# Patient Record
Sex: Female | Born: 1975 | Race: Black or African American | Hispanic: No | Marital: Single | Smoking: Never smoker
Health system: Southern US, Community
[De-identification: ages and names within clinical notes are randomized; demographics above are authoritative.]

## PROBLEM LIST (undated history)

## (undated) HISTORY — PX: OOPHORECTOMY: SHX86

---

## 2022-06-01 ENCOUNTER — Emergency Department
Admission: EM | Admit: 2022-06-01 | Discharge: 2022-06-01 | Disposition: A | Discharge status: Home / Self Care | Payer: Medicaid Other | Attending: Emergency Medicine | Admitting: Emergency Medicine

## 2022-06-01 ENCOUNTER — Encounter: Payer: Self-pay | Admitting: Emergency Medicine

## 2022-06-01 ENCOUNTER — Emergency Department: Payer: Medicaid Other

## 2022-06-01 DIAGNOSIS — B9689 Other specified bacterial agents as the cause of diseases classified elsewhere: Secondary | ICD-10-CM | POA: Insufficient documentation

## 2022-06-01 DIAGNOSIS — N390 Urinary tract infection, site not specified: Secondary | ICD-10-CM | POA: Insufficient documentation

## 2022-06-01 DIAGNOSIS — R1031 Right lower quadrant pain: Secondary | ICD-10-CM

## 2022-06-01 DIAGNOSIS — R112 Nausea with vomiting, unspecified: Secondary | ICD-10-CM | POA: Insufficient documentation

## 2022-06-01 LAB — CBC
HCT: 41.5 % (ref 36.0–46.0)
Hemoglobin: 12.8 g/dL (ref 12.0–15.0)
MCH: 27.5 pg (ref 26.0–34.0)
MCHC: 30.8 g/dL (ref 30.0–36.0)
MCV: 89.1 fL (ref 80.0–100.0)
Platelets: 307 10*3/uL (ref 150–400)
RBC: 4.66 MIL/uL (ref 3.87–5.11)
RDW: 11.7 % (ref 11.5–15.5)
WBC: 7.2 10*3/uL (ref 4.0–10.5)
nRBC: 0 % (ref 0.0–0.2)

## 2022-06-01 LAB — COMPREHENSIVE METABOLIC PANEL
ALT: 19 U/L (ref 0–44)
AST: 16 U/L (ref 15–41)
Albumin: 4.7 g/dL (ref 3.5–5.0)
Alkaline Phosphatase: 67 U/L (ref 38–126)
Anion gap: 6 (ref 5–15)
BUN: 19 mg/dL (ref 6–20)
CO2: 25 mmol/L (ref 22–32)
Calcium: 9.5 mg/dL (ref 8.9–10.3)
Chloride: 110 mmol/L (ref 98–111)
Creatinine, Ser: 1.05 mg/dL — ABNORMAL HIGH (ref 0.44–1.00)
GFR, Estimated: >60 mL/min (ref >60)
Glucose, Bld: 133 mg/dL — ABNORMAL HIGH (ref 70–99)
Potassium: 4.8 mmol/L (ref 3.5–5.1)
Sodium: 141 mmol/L (ref 135–145)
Total Bilirubin: 0.5 mg/dL (ref 0.3–1.2)
Total Protein: 7.9 g/dL (ref 6.5–8.1)

## 2022-06-01 LAB — URINALYSIS, ROUTINE W REFLEX MICROSCOPIC
Bacteria, UA: NONE SEEN
Bilirubin Urine: NEGATIVE
Glucose, UA: NEGATIVE mg/dL
Ketones, ur: NEGATIVE mg/dL
Leukocytes,Ua: NEGATIVE
Nitrite: NEGATIVE
Protein, ur: 100 mg/dL — AB
RBC / HPF: >50 RBC/hpf — ABNORMAL HIGH (ref 0–5)
Specific Gravity, Urine: 1.028 (ref 1.005–1.030)
WBC, UA: >50 WBC/hpf — ABNORMAL HIGH (ref 0–5)
pH: 6 (ref 5.0–8.0)

## 2022-06-01 LAB — LIPASE, BLOOD: Lipase: 27 U/L (ref 11–51)

## 2022-06-01 LAB — POC URINE PREG, ED: Preg Test, Ur: NEGATIVE

## 2022-06-01 MED ORDER — SODIUM CHLORIDE 0.9 % IV SOLN
1.0000 g | Freq: Once | INTRAVENOUS | Status: AC
  Administered 2022-06-01: 1 g via INTRAVENOUS
  Filled 2022-06-01: qty 10

## 2022-06-01 MED ORDER — CEPHALEXIN 500 MG PO CAPS: 500.0000 mg | ORAL_CAPSULE | Freq: Two times a day (BID) | ORAL | 0 refills | Status: AC

## 2022-06-01 MED ORDER — IOHEXOL 300 MG/ML  SOLN
100.0000 mL | Freq: Once | INTRAMUSCULAR | Status: AC | PRN
  Administered 2022-06-01: 100 mL via INTRAVENOUS

## 2022-06-01 MED ORDER — ONDANSETRON HCL 4 MG/2ML IJ SOLN
4.0000 mg | Freq: Once | INTRAMUSCULAR | Status: AC
  Administered 2022-06-01: 4 mg via INTRAVENOUS
  Filled 2022-06-01: qty 2

## 2022-06-01 MED ORDER — ONDANSETRON 4 MG PO TBDP: 4.0000 mg | ORAL_TABLET | Freq: Four times a day (QID) | ORAL | 0 refills | Status: AC | PRN

## 2022-06-01 MED ORDER — MORPHINE SULFATE (PF) 4 MG/ML IV SOLN
4.0000 mg | Freq: Once | INTRAVENOUS | Status: AC
  Administered 2022-06-01: 4 mg via INTRAVENOUS
  Filled 2022-06-01: qty 1

## 2022-06-01 MED ORDER — SODIUM CHLORIDE 0.9 % IV BOLUS (SEPSIS)
1000.0000 mL | Freq: Once | INTRAVENOUS | Status: AC
  Administered 2022-06-01: 1000 mL via INTRAVENOUS

## 2022-06-01 NOTE — ED Notes (Signed)
Pt discharge information reviewed. Pt understands need for follow up care and when to return if symptoms worsen. All questions answered. Pt is alert and oriented with even and regular respirations. Pt is seen ambulating out of department with string steady gait.   

## 2022-06-01 NOTE — ED Notes (Signed)
ED Provider at bedside. 

## 2022-06-01 NOTE — ED Triage Notes (Signed)
Pt presents via EMS with complaints of RLQ pain for the last 4 days with associated N/V. No meds given PTA. Pt denies abdominal surgeries, urinary sx,CP, or SOB.

## 2022-06-01 NOTE — Discharge Instructions (Addendum)
You may alternate Tylenol 1000 mg every 6 hours as needed for pain, fever and Ibuprofen 800 mg every 6-8 hours as needed for pain, fever.  Please take Ibuprofen with food.  Do not take more than 4000 mg of Tylenol (acetaminophen) in a 24 hour period.  Steps to find a Primary Care Provider (PCP):  Call [phone] or [phone] to access "[facility] Find a Doctor Service."  2.  You may also go on the [facility] website at [email or web address]  

## 2022-06-01 NOTE — ED Provider Notes (Signed)
[facility] Provider Note    Event Date/Time   First MD Initiated Contact with Patient 06/01/22 [phone]     (approximate)   History   Abdominal Pain   HPI  Marilyn Moody is a 46 y.o. female with history of left oophorectomy who presents to the emergency department with 4 days of right lower quadrant abdominal pain, nausea, vomiting, diarrhea.  No known fevers.  No dysuria, hematuria, vaginal bleeding or discharge.  No known sick contacts.   History provided by patient.    History reviewed. No pertinent past medical history.  Past Surgical History:  Procedure Laterality Date   OOPHORECTOMY Left     MEDICATIONS:  Prior to Admission medications   Not on File    Physical Exam   Triage Vital Signs: ED Triage Vitals [06/01/22 0131]  Enc Vitals Group     BP 111/66     Pulse Rate 87     Resp 20     Temp 98.2 F (36.8 C)     Temp Source Oral     SpO2 98 %     Weight 160 lb (72.6 kg)     Height 5\' 6"  (1.676 m)     Head Circumference      Peak Flow      Pain Score 8     Pain Loc      Pain Edu?      Excl. in GC?     Most recent vital signs: Vitals:   06/01/22 0131 06/01/22 0531  BP: 111/66 120/79  Pulse: 87 82  Resp: 20 17  Temp: 98.2 F (36.8 C)   SpO2: 98% 98%    CONSTITUTIONAL: Alert and oriented and responds appropriately to questions. Well-appearing; well-nourished HEAD: Normocephalic, atraumatic EYES: Conjunctivae clear, pupils appear equal, sclera nonicteric ENT: normal nose; moist mucous membranes NECK: Supple, normal ROM CARD: RRR; S1 and S2 appreciated; no murmurs, no clicks, no rubs, no gallops RESP: Normal chest excursion without splinting or tachypnea; breath sounds clear and equal bilaterally; no wheezes, no rhonchi, no rales, no hypoxia or respiratory distress, speaking full sentences ABD/GI: Normal bowel sounds; non-distended; soft, tender to palpation in the right lower quadrant without guarding or rebound, no  pelvic tenderness, no tenderness to right upper abdomen BACK: The back appears normal EXT: Normal ROM in all joints; no deformity noted, no edema; no cyanosis SKIN: Normal color for age and race; warm; no rash on exposed skin NEURO: Moves all extremities equally, normal speech PSYCH: The patient's mood and manner are appropriate.   ED Results / Procedures / Treatments   LABS: (all labs ordered are listed, but only abnormal results are displayed) Labs Reviewed  COMPREHENSIVE METABOLIC PANEL - Abnormal; Notable for the following components:      Result Value   Glucose, Bld 133 (*)    Creatinine, Ser 1.05 (*)    All other components within normal limits  URINALYSIS, ROUTINE W REFLEX MICROSCOPIC - Abnormal; Notable for the following components:   Color, Urine AMBER (*)    APPearance CLOUDY (*)    Hgb urine dipstick LARGE (*)    Protein, ur 100 (*)    RBC / HPF >50 (*)    WBC, UA >50 (*)    All other components within normal limits  URINE CULTURE  LIPASE, BLOOD  CBC  POC URINE PREG, ED     EKG:  RADIOLOGY: My personal review and interpretation of imaging: CT abdomen pelvis shows normal appendix.  No other acute abnormality.  No kidney stone or hydronephrosis.  Adnexa appear normal.  I have personally reviewed all radiology reports.   CT ABDOMEN PELVIS W CONTRAST  Result Date: 06/01/2022 CLINICAL DATA:  46 year old female presenting with history of right lower quadrant abdominal pain for the past 4 days with some associated nausea and vomiting. EXAM: CT ABDOMEN AND PELVIS WITH CONTRAST TECHNIQUE: Multidetector CT imaging of the abdomen and pelvis was performed using the standard protocol following bolus administration of intravenous contrast. RADIATION DOSE REDUCTION: This exam was performed according to the departmental dose-optimization program which includes automated exposure control, adjustment of the mA and/or kV according to patient size and/or use of iterative  reconstruction technique. CONTRAST:  OMNIPAQUE IOHEXOL 300 MG/ML  SOLN COMPARISON:  No priors. FINDINGS: Lower chest: Unremarkable. Hepatobiliary: No suspicious cystic or solid hepatic lesions. No intra or extrahepatic biliary ductal dilatation. Gallbladder is unremarkable in appearance. Pancreas: No pancreatic mass. No pancreatic ductal dilatation. No pancreatic or peripancreatic fluid collections or inflammatory changes. Spleen: Unremarkable. Adrenals/Urinary Tract: Bilateral kidneys and bilateral adrenal glands are normal in appearance. No hydroureteronephrosis. Urinary bladder is normal in appearance. Stomach/Bowel: Stomach is unremarkable in appearance. No pathologic dilatation of small bowel or colon. Normal appendix. Vascular/Lymphatic: No significant atherosclerotic disease, aneurysm or dissection noted in the abdominal or pelvic vasculature. No lymphadenopathy noted in the abdomen or pelvis. Reproductive: Uterus and ovaries are unremarkable in appearance. Other: No significant volume of ascites.  No pneumoperitoneum. Musculoskeletal: 7 mm of anterolisthesis of L5 upon S1. Bilateral pars defects at L5. There are no aggressive appearing lytic or blastic lesions noted in the visualized portions of the skeleton. IMPRESSION: 1. No acute findings are noted in the abdomen or pelvis to account for the patient's symptoms. Specifically, the appendix is normal. 2. Grade 1 spondylolisthesis of L5 upon S1. Electronically Signed   By: Trudie Reed M.D.   On: 06/01/2022 05:33     PROCEDURES:  Critical Care performed: No    Procedures    IMPRESSION / MDM / ASSESSMENT AND PLAN / ED COURSE  I reviewed the triage vital signs and the nursing notes.    Patient here with nausea, vomiting, diarrhea and right lower quadrant abdominal pain.    DIFFERENTIAL DIAGNOSIS (includes but not limited to):   Viral gastroenteritis, appendicitis, colitis, diverticulitis, bowel obstruction, UTI, kidney stone,  pyelonephritis, TOA, ectopic pregnancy, ovarian cyst, ovarian torsion   Patient's presentation is most consistent with acute presentation with potential threat to life or bodily function.   PLAN: We will obtain CBC, CMP, lipase, urinalysis, CT of the abdomen pelvis.  Will give IV fluids, pain and nausea medicine.   MEDICATIONS GIVEN IN ED: Medications  sodium chloride 0.9 % bolus 1,000 mL (1,000 mLs Intravenous New Bag/Given 06/01/22 0540)  cefTRIAXone (ROCEPHIN) 1 g in sodium chloride 0.9 % 100 mL IVPB (1 g Intravenous New Bag/Given 06/01/22 0538)  ondansetron (ZOFRAN) injection 4 mg (4 mg Intravenous Given 06/01/22 0534)  morphine (PF) 4 MG/ML injection 4 mg (4 mg Intravenous Given 06/01/22 0535)  iohexol (OMNIPAQUE) 300 MG/ML solution 100 mL (100 mLs Intravenous Contrast Given 06/01/22 0512)     ED COURSE: Patient's labs show no leukocytosis.  Normal hemoglobin, renal function, electrolytes, LFTs, lipase.  Pregnancy test negative.  Urine does show greater than 50,000 red blood cells and white blood cells but no bacteria.  We will add on a urine culture.  Will give Rocephin for possible UTI.   CT abdomen pelvis reviewed/interpreted by  myself and radiologist and shows no appendicitis.  No colitis, diverticulitis, bowel obstruction, mass.  Adnexa appear normal.  Pain is not in the pelvic region.  I do not feel she needs a transvaginal ultrasound or pelvic exam today.  Suspect UTI as a cause of her symptoms.  She reports feeling better and is tolerating p.o.  Will discharge with prescription of Keflex, zofran.  Recommended alternating Tylenol, ibuprofen over-the-counter for pain.  At this time, I do not feel there is any life-threatening condition present. I reviewed all nursing notes, vitals, pertinent previous records.  All lab and urine results, EKGs, imaging ordered have been independently reviewed and interpreted by myself.  I reviewed all available radiology reports from any imaging ordered  this visit.  Based on my assessment, I feel the patient is safe to be discharged home without further emergent workup and can continue workup as an outpatient as needed. Discussed all findings, treatment plan as well as usual and customary return precautions with patient.  They verbalize understanding and are comfortable with this plan.  Outpatient follow-up has been provided as needed.  All questions have been answered.    CONSULTS: Admission considered but given no appendicitis or acute abnormality seen on CT imaging, patient safe to be discharged home.   OUTSIDE RECORDS REVIEWED: Reviewed patient's last office visit with Marcelene Butte on 12/29/2021.       FINAL CLINICAL IMPRESSION(S) / ED DIAGNOSES   Final diagnoses:  RLQ abdominal pain  Nausea vomiting and diarrhea  Acute UTI     Rx / DC Orders   ED Discharge Orders          Ordered    cephALEXin (KEFLEX) 500 MG capsule  2 times daily        06/01/22 0553    ondansetron (ZOFRAN-ODT) 4 MG disintegrating tablet  Every 6 hours PRN        06/01/22 0553             Note:  This document was prepared using Dragon voice recognition software and may include unintentional dictation errors.   Courvoisier Hamblen, Layla Maw, DO 06/01/22 [phone]

## 2022-06-01 NOTE — ED Notes (Signed)
Pt returned from CT °

## 2022-06-02 LAB — URINE CULTURE

## 2023-06-09 IMAGING — CT CT ABD-PELV W/ CM
2 of 4 series · 16 of 46 positions shown, 18 images · IV contrast (agent unspecified)
Comparison: No priors.

CLINICAL DATA: 45-year-old female presenting with history of right
lower quadrant abdominal pain for the past 4 days with some
associated nausea and vomiting.

EXAM:
CT ABDOMEN AND PELVIS WITH CONTRAST
TECHNIQUE: Multidetector CT imaging of the abdomen and pelvis was performed
using the standard protocol following bolus administration of
intravenous contrast.

[Series 2: abdomen 5.0 · axial · 0.73mm/px · z∈[-1072,-638]mm · 13 of 99 slices shown, 15 images]
[im 6/99  soft-tissue]
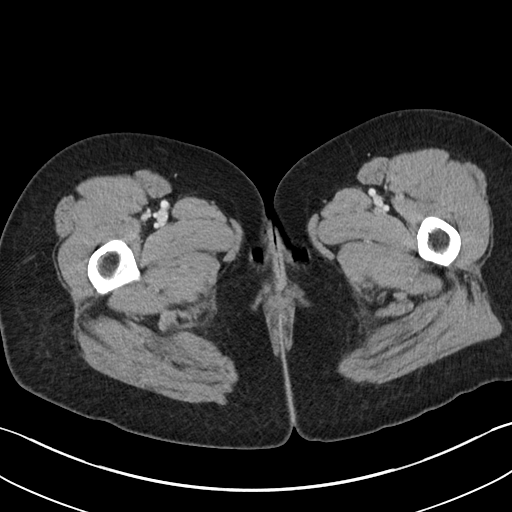
[im 6/99  bone]
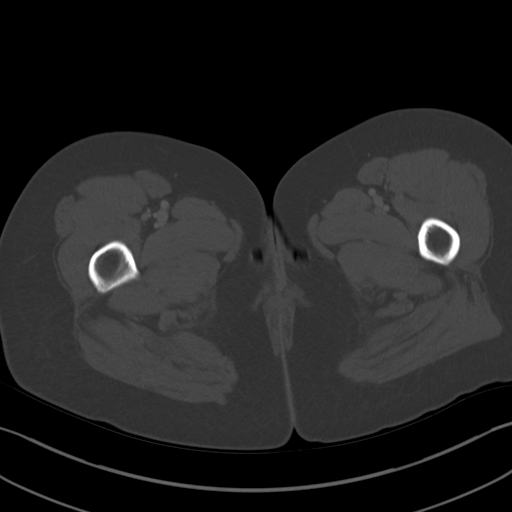
[im 11/99  soft-tissue]
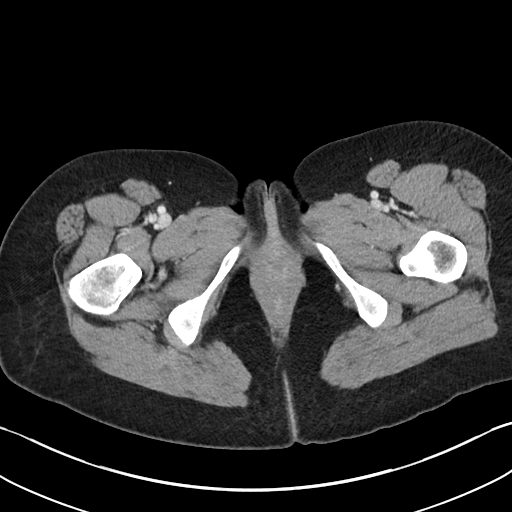
[im 22/99  soft-tissue]
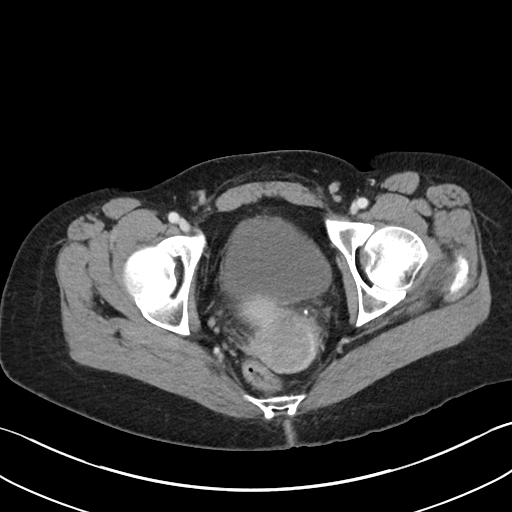
[im 28/99  soft-tissue]
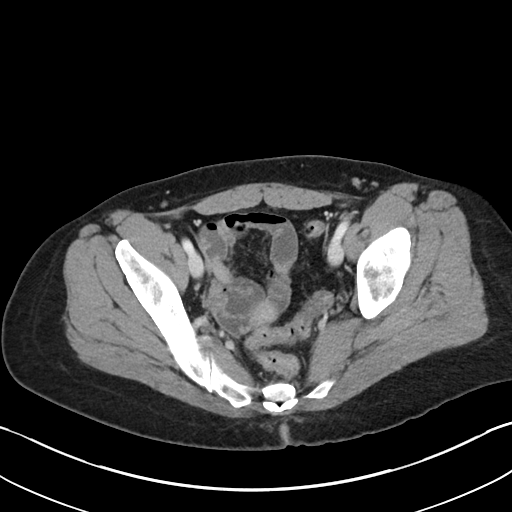
[im 33/99  soft-tissue]
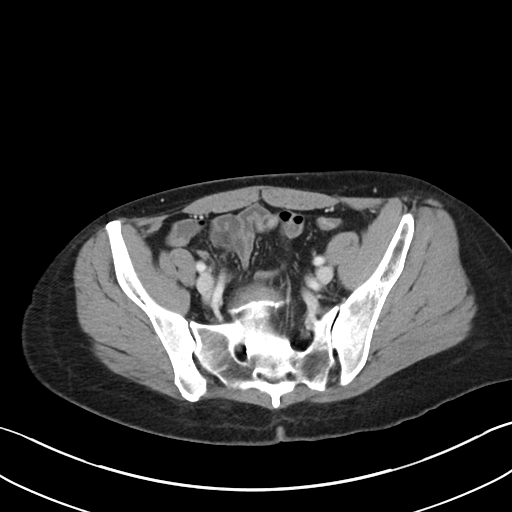
[im 44/99  soft-tissue]
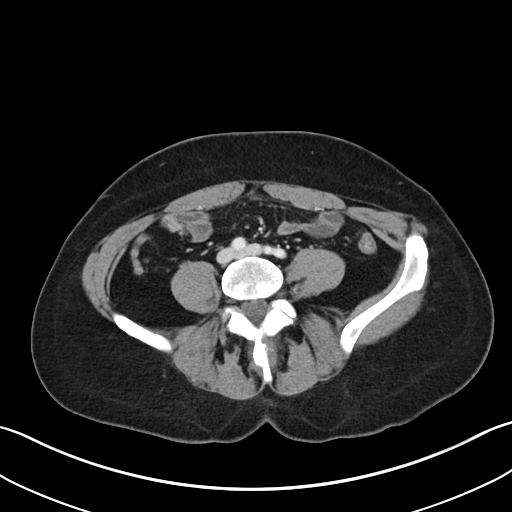
[im 50/99  soft-tissue]
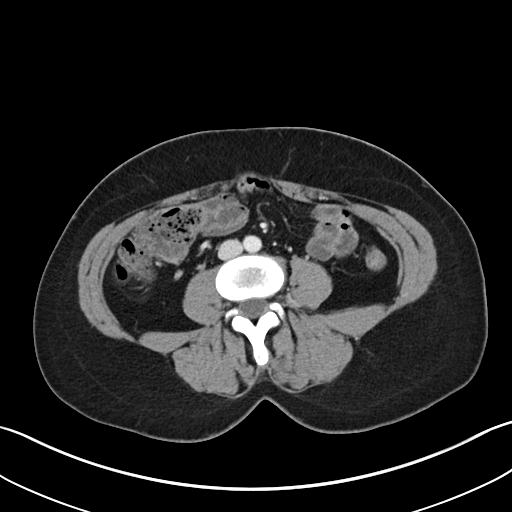
[im 55/99  soft-tissue]
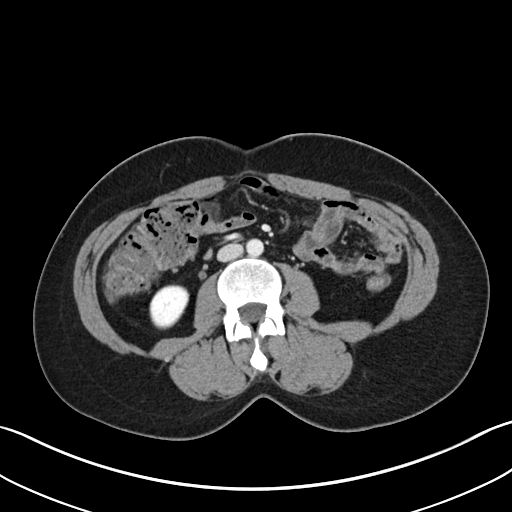
[im 66/99  soft-tissue]
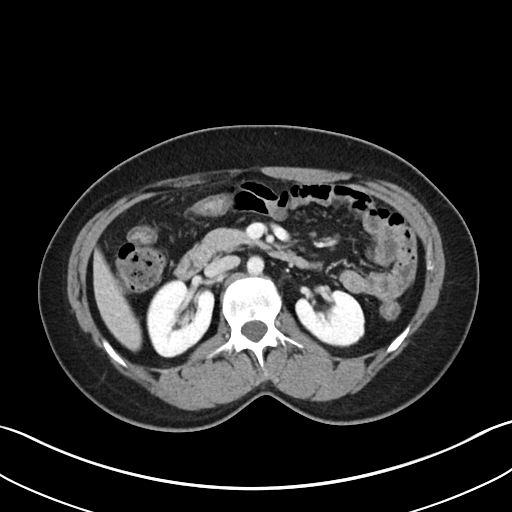
[im 66/99  bone]
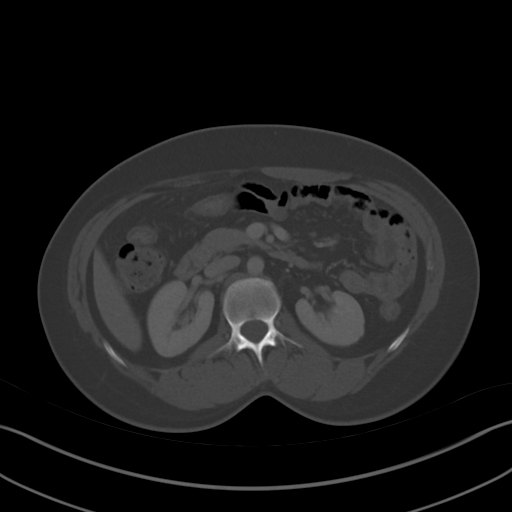
[im 71/99  soft-tissue]
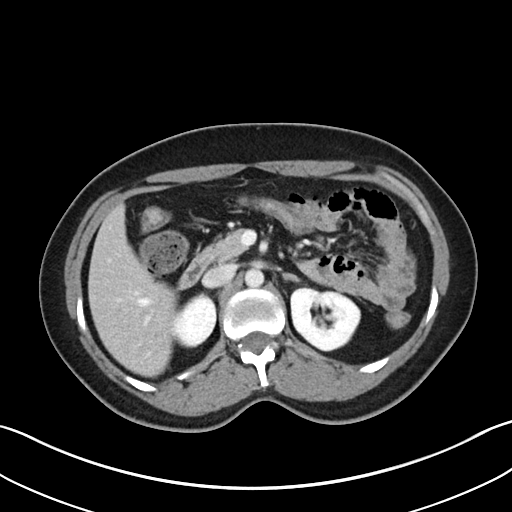
[im 77/99  soft-tissue]
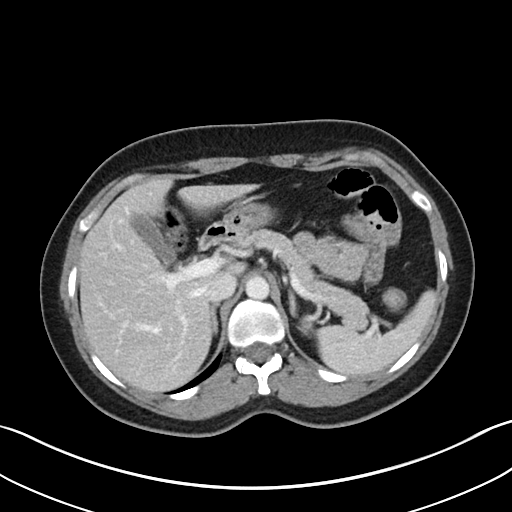
[im 88/99  soft-tissue]
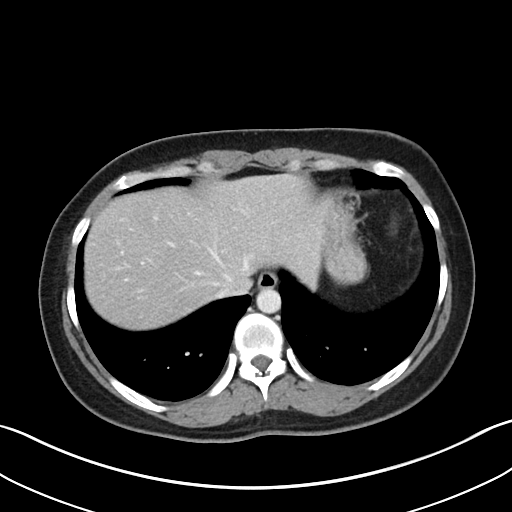
[im 93/99  soft-tissue]
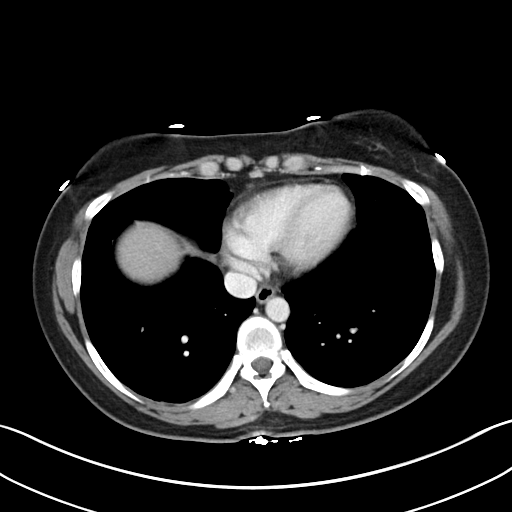

[Series 5: abdomen 3.0 mpr cor · coronal · 0.78mm/px · 3 of 86 slices shown]
[im 29/86  soft-tissue]
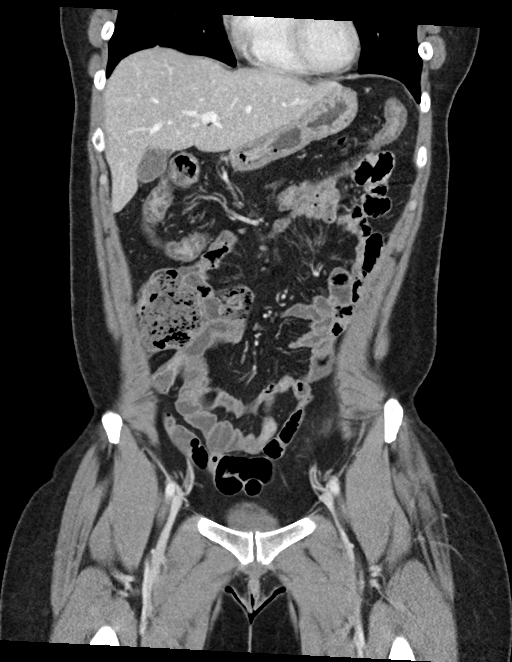
[im 38/86  soft-tissue]
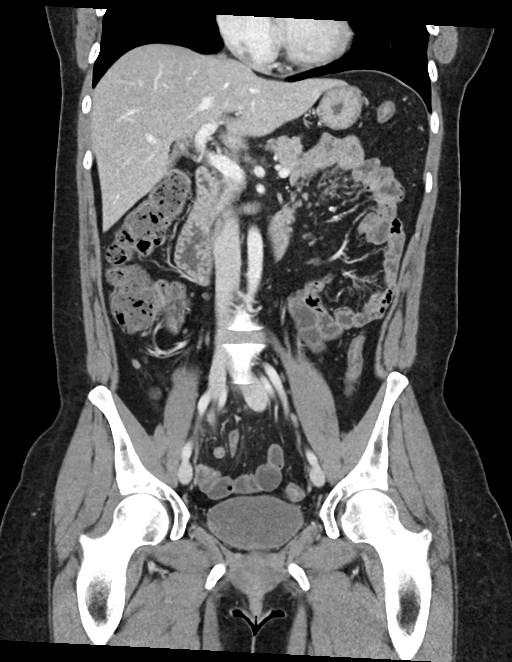
[im 48/86  soft-tissue]
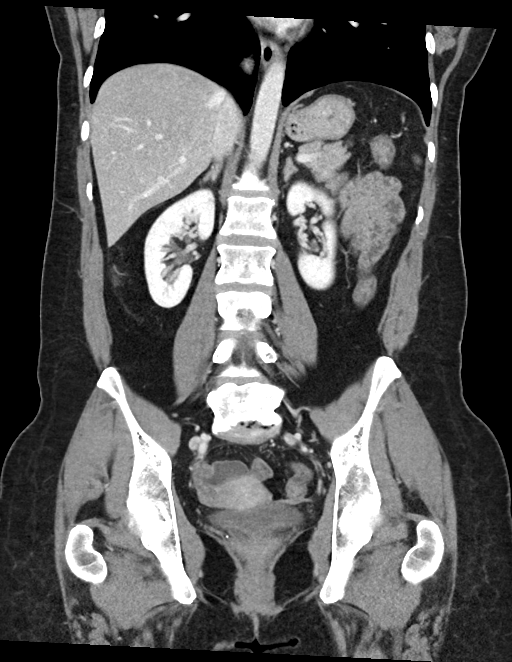

[16 of 46 positions shown; findings below may reference images not displayed]

RADIATION DOSE REDUCTION: This exam was performed according to the
departmental dose-optimization program which includes automated
exposure control, adjustment of the mA and/or kV according to
patient size and/or use of iterative reconstruction technique.

CONTRAST:  100mL OMNIPAQUE IOHEXOL 300 MG/ML  SOLN
FINDINGS: Lower chest: Unremarkable.

Hepatobiliary: No suspicious cystic or solid hepatic lesions. No
intra or extrahepatic biliary ductal dilatation. Gallbladder is
unremarkable in appearance.

Pancreas: No pancreatic mass. No pancreatic ductal dilatation. No
pancreatic or peripancreatic fluid collections or inflammatory
changes.

Spleen: Unremarkable.

Adrenals/Urinary Tract: Bilateral kidneys and bilateral adrenal
glands are normal in appearance. No hydroureteronephrosis. Urinary
bladder is normal in appearance.

Stomach/Bowel: Stomach is unremarkable in appearance. No pathologic
dilatation of small bowel or colon. Normal appendix.

Vascular/Lymphatic: No significant atherosclerotic disease, aneurysm
or dissection noted in the abdominal or pelvic vasculature. No
lymphadenopathy noted in the abdomen or pelvis.

Reproductive: Uterus and ovaries are unremarkable in appearance.

Other: No significant volume of ascites.  No pneumoperitoneum.

Musculoskeletal: 7 mm of anterolisthesis of L5 upon S1. Bilateral
pars defects at L5. There are no aggressive appearing lytic or
blastic lesions noted in the visualized portions of the skeleton.
IMPRESSION: 1. No acute findings are noted in the abdomen or pelvis to account
for the patient's symptoms. Specifically, the appendix is normal.
2. Grade 1 spondylolisthesis of L5 upon S1.
